# Patient Record
Sex: Male | Born: 1990 | Race: Black or African American | Marital: Single | Smoking: Former smoker
Health system: Southern US, Community
[De-identification: ages and names within clinical notes are randomized; demographics above are authoritative.]

## PROBLEM LIST (undated history)

## (undated) DIAGNOSIS — J45909 Unspecified asthma, uncomplicated: Secondary | ICD-10-CM

---
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
## 2019-02-14 ENCOUNTER — Emergency Department (HOSPITAL_COMMUNITY)
Admission: EM | Admit: 2019-02-14 | Discharge: 2019-02-14 | Disposition: A | Discharge status: Home / Self Care | Payer: Self-pay | Attending: Emergency Medicine | Admitting: Emergency Medicine

## 2019-02-14 ENCOUNTER — Encounter (HOSPITAL_COMMUNITY): Payer: Self-pay

## 2019-02-14 ENCOUNTER — Other Ambulatory Visit: Payer: Self-pay

## 2019-02-14 DIAGNOSIS — F17290 Nicotine dependence, other tobacco product, uncomplicated: Secondary | ICD-10-CM | POA: Insufficient documentation

## 2019-02-14 DIAGNOSIS — Z202 Contact with and (suspected) exposure to infections with a predominantly sexual mode of transmission: Secondary | ICD-10-CM | POA: Insufficient documentation

## 2019-02-14 DIAGNOSIS — N509 Disorder of male genital organs, unspecified: Secondary | ICD-10-CM | POA: Insufficient documentation

## 2019-02-14 MED ORDER — METRONIDAZOLE 500 MG PO TABS
2000.0000 mg | ORAL_TABLET | Freq: Once | ORAL | Status: AC
  Administered 2019-02-14: 2000 mg via ORAL
  Filled 2019-02-14: qty 4

## 2019-02-14 NOTE — ED Provider Notes (Signed)
[facility] EMERGENCY DEPARTMENT Provider Note   CSN: [id number] Arrival date & time: 02/14/19  0017    History   Chief Complaint Chief Complaint  Patient presents with  . Exposure to STD    HPI Frank Ritter is a 28 y.o. male.     Patient states that his girlfriend states he has been exposed to trichomonas.  He also complains of a growth on his scrotum  No language interpreter was used.  Exposure to STD  This is a new problem. The current episode started more than 2 days ago. The problem occurs constantly. The problem has not changed since onset.Pertinent negatives include no chest pain, no abdominal pain and no headaches. Nothing aggravates the symptoms. Nothing relieves the symptoms. The treatment provided no relief.    History reviewed. No pertinent past medical history.  There are no active problems to display for this patient.   History reviewed. No pertinent surgical history.      Home Medications    Prior to Admission medications   Not on File    Family History No family history on file.  Social History Social History   Tobacco Use  . Smoking status: Former Smoker    Packs/day: 1.00    Types: Cigarettes  . Smokeless tobacco: Never Used  . Tobacco comment: pt states he quit 2 days ago, but smokes cigar (2 a day)  Substance Use Topics  . Alcohol use: Not on file    Comment: hardly ever  . Drug use: Yes    Types: Marijuana     Allergies   Patient has no known allergies.   Review of Systems Review of Systems  Constitutional: Negative for appetite change and fatigue.  HENT: Negative for congestion, ear discharge and sinus pressure.   Eyes: Negative for discharge.  Respiratory: Negative for cough.   Cardiovascular: Negative for chest pain.  Gastrointestinal: Negative for abdominal pain and diarrhea.  Genitourinary: Negative for frequency and hematuria.       Growth on his scrotum  Musculoskeletal: Negative for back pain.  Skin:  Negative for rash.  Neurological: Negative for seizures and headaches.  Psychiatric/Behavioral: Negative for hallucinations.  All other systems reviewed and are negative.    Physical Exam Updated Vital Signs Ht 5\' 7"  (1.702 m)   Wt 113.4 kg   BMI 39.16 kg/m   Physical Exam Vitals signs and nursing note reviewed.  Constitutional:      Appearance: He is well-developed.  HENT:     Head: Normocephalic.  Eyes:     General: No scleral icterus.    Conjunctiva/sclera: Conjunctivae normal.  Neck:     Musculoskeletal: Neck supple.     Thyroid: No thyromegaly.  Cardiovascular:     Rate and Rhythm: Normal rate and regular rhythm.     Heart sounds: No murmur. No friction rub. No gallop.   Pulmonary:     Breath sounds: No stridor. No wheezing or rales.  Chest:     Chest wall: No tenderness.  Abdominal:     General: There is no distension.     Tenderness: There is no abdominal tenderness. There is no rebound.  Genitourinary:    Comments: Patient has a 5 mm mass on his scrotum that does not seem to be attached to the testicle. Musculoskeletal: Normal range of motion.  Lymphadenopathy:     Cervical: No cervical adenopathy.  Skin:    Findings: No erythema or rash.  Neurological:     Mental Status: He is  oriented to person, place, and time.     Motor: No abnormal muscle tone.     Coordination: Coordination normal.  Psychiatric:        Behavior: Behavior normal.      ED Treatments / Results  Labs (all labs ordered are listed, but only abnormal results are displayed) Labs Reviewed - No data to display  EKG None  Radiology No results found.  Procedures Procedures (including critical care time)  Medications Ordered in ED Medications  metroNIDAZOLE (FLAGYL) tablet 2,000 mg (has no administration in time range)     Initial Impression / Assessment and Plan / ED Course  I have reviewed the triage vital signs and the nursing notes.  Pertinent labs & imaging results  that were available during my care of the patient were reviewed by me and considered in my medical decision making (see chart for details).        Patient with exposure to trichomonas.  He will be given 2 g of Flagyl.  Patient also has a small growth on his scrotum which is most likely a benign cyst.  He will be referred to urology for second opinion  Final Clinical Impressions(s) / ED Diagnoses   Final diagnoses:  STD exposure    ED Discharge Orders    None       Bethann BerkshireZammit, Shian Goodnow, MD 02/14/19 0134

## 2019-02-14 NOTE — Discharge Instructions (Addendum)
Follow up with [facility] in 2-3 weeks for your growth on your scrotum.  Call for an appointment

## 2019-02-14 NOTE — ED Triage Notes (Signed)
Pt states he has a "cyst in his sac" that he noticed 2-3 days ago. Pt reports his girlfriend let him know that she has been diagnosed with trich and been treated. Pt reports having unprotected intercourse with girlfriend. Pt says the "knot or cyst is not on the outside, it is on the inside." Denies pain to this area.

## 2019-11-01 ENCOUNTER — Encounter (HOSPITAL_COMMUNITY): Payer: Self-pay

## 2019-11-01 ENCOUNTER — Emergency Department (HOSPITAL_COMMUNITY)
Admission: EM | Admit: 2019-11-01 | Discharge: 2019-11-01 | Disposition: A | Discharge status: Home / Self Care | Payer: Self-pay | Attending: Emergency Medicine | Admitting: Emergency Medicine

## 2019-11-01 ENCOUNTER — Ambulatory Visit: Payer: Self-pay | Attending: Internal Medicine

## 2019-11-01 ENCOUNTER — Other Ambulatory Visit: Payer: Self-pay

## 2019-11-01 ENCOUNTER — Emergency Department (HOSPITAL_COMMUNITY): Payer: Self-pay

## 2019-11-01 DIAGNOSIS — R05 Cough: Secondary | ICD-10-CM

## 2019-11-01 DIAGNOSIS — R059 Cough, unspecified: Secondary | ICD-10-CM

## 2019-11-01 DIAGNOSIS — Z20822 Contact with and (suspected) exposure to covid-19: Secondary | ICD-10-CM | POA: Insufficient documentation

## 2019-11-01 DIAGNOSIS — J4541 Moderate persistent asthma with (acute) exacerbation: Secondary | ICD-10-CM | POA: Insufficient documentation

## 2019-11-01 DIAGNOSIS — Z87891 Personal history of nicotine dependence: Secondary | ICD-10-CM | POA: Insufficient documentation

## 2019-11-01 HISTORY — DX: Unspecified asthma, uncomplicated: J45.909

## 2019-11-01 MED ORDER — ALBUTEROL SULFATE HFA 108 (90 BASE) MCG/ACT IN AERS
8.0000 | INHALATION_SPRAY | Freq: Once | RESPIRATORY_TRACT | Status: AC
  Administered 2019-11-01: 16:00:00 8 via RESPIRATORY_TRACT
  Filled 2019-11-01: qty 6.7

## 2019-11-01 MED ORDER — PREDNISONE 50 MG PO TABS
60.0000 mg | ORAL_TABLET | Freq: Once | ORAL | Status: AC
  Administered 2019-11-01: 16:00:00 60 mg via ORAL
  Filled 2019-11-01: qty 1

## 2019-11-01 MED ORDER — ALBUTEROL SULFATE HFA 108 (90 BASE) MCG/ACT IN AERS: 2.0000 | INHALATION_SPRAY | Freq: Four times a day (QID) | RESPIRATORY_TRACT | 1 refills | Status: DC | PRN

## 2019-11-01 MED ORDER — PREDNISONE 10 MG PO TABS: ORAL_TABLET | ORAL | 0 refills | Status: DC

## 2019-11-01 NOTE — ED Triage Notes (Signed)
Pt presents to ED with complaints of cough. Pt states he vomited with coughing. Pt denies fever.

## 2019-11-01 NOTE — ED Provider Notes (Signed)
[facility] Provider Note   CSN: [id number] Arrival date & time: 11/01/19  1307     History Chief Complaint  Patient presents with  . Cough    Frank Ritter is a 29 y.o. male.  The history is provided by the patient. No language interpreter was used.  Cough Cough characteristics:  Non-productive Sputum characteristics:  Nondescript Severity:  Moderate Onset quality:  Gradual Duration:  1 week Timing:  Constant Progression:  Worsening Chronicity:  New Smoker: yes   Relieved by:  Nothing Worsened by:  Nothing Ineffective treatments:  None tried Associated symptoms: no fever   Risk factors: no recent infection   Pt had a covid swab today.  Pt reports he has a history of asthma Pt does not have an inhaler.  Pt denies fever.      Past Medical History:  Diagnosis Date  . Asthma     There are no problems to display for this patient.   History reviewed. No pertinent surgical history.     No family history on file.  Social History   Tobacco Use  . Smoking status: Former Smoker    Packs/day: 1.00    Types: Cigarettes  . Smokeless tobacco: Never Used  . Tobacco comment: pt states he quit 2 days ago, but smokes cigar (2 a day)  Substance Use Topics  . Alcohol use: Yes    Comment: hardly ever  . Drug use: Yes    Types: Marijuana    Home Medications Prior to Admission medications   Not on File    Allergies    Patient has no known allergies.  Review of Systems   Review of Systems  Constitutional: Negative for fever.  Respiratory: Positive for cough.   All other systems reviewed and are negative.   Physical Exam Updated Vital Signs BP 137/74 (BP Location: Right Arm)   Pulse 82   Temp 98.4 F (36.9 C) (Oral)   Resp 16   Ht 5\' 7"  (1.702 m)   Wt 117.9 kg   SpO2 93%   BMI 40.72 kg/m   Physical Exam Vitals and nursing note reviewed.  Constitutional:      Appearance: He is well-developed.  HENT:     Head:  Normocephalic and atraumatic.  Eyes:     Conjunctiva/sclera: Conjunctivae normal.  Cardiovascular:     Rate and Rhythm: Normal rate and regular rhythm.     Heart sounds: No murmur.  Pulmonary:     Effort: Pulmonary effort is normal. No respiratory distress.     Breath sounds: Wheezing present.  Abdominal:     Palpations: Abdomen is soft.     Tenderness: There is no abdominal tenderness.  Musculoskeletal:     Cervical back: Neck supple.  Skin:    General: Skin is warm and dry.  Neurological:     Mental Status: He is alert.  Psychiatric:        Mood and Affect: Mood normal.     ED Results / Procedures / Treatments   Labs (all labs ordered are listed, but only abnormal results are displayed) Labs Reviewed - No data to display  EKG None  Radiology DG Chest Portable 1 View  Result Date: 11/01/2019 CLINICAL DATA:  Cough, fever EXAM: PORTABLE CHEST 1 VIEW COMPARISON:  None. FINDINGS: The heart size and mediastinal contours are within normal limits. Both lungs are clear. The visualized skeletal structures are unremarkable. IMPRESSION: No acute abnormality of the lungs in AP portable examination. Electronically Signed  By: Lauralyn Primes M.D.   On: 11/01/2019 15:49    Procedures Procedures (including critical care time)  Medications Ordered in ED Medications  predniSONE (DELTASONE) tablet 60 mg (has no administration in time range)  albuterol (VENTOLIN HFA) 108 (90 Base) MCG/ACT inhaler 8 puff (8 puffs Inhalation Given 11/01/19 1547)    ED Course  I have reviewed the triage vital signs and the nursing notes.  Pertinent labs & imaging results that were available during my care of the patient were reviewed by me and considered in my medical decision making (see chart for details).    MDM Rules/Calculators/A&P                      MDM:  Pt reports he feels much better after albuterol 8 puffs.   Covid pending.  Pt given prednisone 60 mg.  Rx for prednisone. Albuterol inhaler  to go and rx for inhaler Pt counseled on need to stop smoking  Final Clinical Impression(s) / ED Diagnoses Final diagnoses:  Moderate persistent asthma with exacerbation  Cough    Rx / DC Orders ED Discharge Orders         Ordered    predniSONE (DELTASONE) 10 MG tablet    Note to Pharmacy: Please provide dose pack or taper dose instructions   11/01/19 1610    albuterol (VENTOLIN HFA) 108 (90 Base) MCG/ACT inhaler  Every 6 hours PRN     11/01/19 1610        An After Visit Summary was printed and given to the patient.    Elson Areas, [place] 11/01/19 1610    Terald Sleeper, MD 11/01/19 2047

## 2019-11-01 NOTE — Discharge Instructions (Signed)
Stop smoking.  Return if any problems.

## 2019-11-03 LAB — NOVEL CORONAVIRUS, NAA: SARS-CoV-2, NAA: NOT DETECTED

## 2020-06-26 ENCOUNTER — Other Ambulatory Visit: Payer: Self-pay

## 2020-06-26 DIAGNOSIS — Z20822 Contact with and (suspected) exposure to covid-19: Secondary | ICD-10-CM

## 2020-06-27 LAB — NOVEL CORONAVIRUS, NAA: SARS-CoV-2, NAA: DETECTED — AB

## 2020-06-27 LAB — SARS-COV-2, NAA 2 DAY TAT

## 2020-06-28 ENCOUNTER — Telehealth (HOSPITAL_COMMUNITY): Payer: Self-pay | Admitting: Nurse Practitioner

## 2020-06-28 ENCOUNTER — Encounter: Payer: Self-pay | Admitting: Nurse Practitioner

## 2020-06-28 DIAGNOSIS — U071 COVID-19: Secondary | ICD-10-CM

## 2020-06-28 NOTE — Telephone Encounter (Signed)
Called to Discuss with patient about Covid symptoms and the use of regeneron, a monoclonal antibody infusion for those with mild to moderate Covid symptoms and at a high risk of hospitalization.     Pt is qualified for this infusion at the [facility] center due to co-morbid conditions and/or a member of an at-risk group.     Unable to reach pt. Sent text message and sent mychart message.   Consuello Masse, DNP, AGNP-C [phone] (Infusion Center Hotline)

## 2020-06-28 NOTE — Telephone Encounter (Signed)
Called to discuss with Frank Ritter about Covid symptoms and the use of regeneron, a monoclonal antibody infusion for those with mild to moderate Covid symptoms and at a high risk of hospitalization.    Pt does not qualify for infusion therapy given asymptomatic infection. Isolation precautions discussed. Advised to contact back for consideration should he develop symptoms. Patient verbalized understanding.     There are no problems to display for this patient.    Consuello Masse, NP [phone]

## 2021-05-29 ENCOUNTER — Other Ambulatory Visit: Payer: Self-pay

## 2021-05-29 ENCOUNTER — Encounter (HOSPITAL_COMMUNITY): Payer: Self-pay | Admitting: *Deleted

## 2021-05-29 ENCOUNTER — Emergency Department (HOSPITAL_COMMUNITY)
Admission: EM | Admit: 2021-05-29 | Discharge: 2021-05-29 | Disposition: A | Discharge status: Home / Self Care | Payer: Self-pay | Attending: Emergency Medicine | Admitting: Emergency Medicine

## 2021-05-29 DIAGNOSIS — Z87891 Personal history of nicotine dependence: Secondary | ICD-10-CM | POA: Insufficient documentation

## 2021-05-29 DIAGNOSIS — J4521 Mild intermittent asthma with (acute) exacerbation: Secondary | ICD-10-CM | POA: Insufficient documentation

## 2021-05-29 MED ORDER — DEXAMETHASONE 4 MG PO TABS
12.0000 mg | ORAL_TABLET | Freq: Once | ORAL | Status: AC
  Administered 2021-05-29: 12 mg via ORAL
  Filled 2021-05-29: qty 3

## 2021-05-29 MED ORDER — ALBUTEROL SULFATE HFA 108 (90 BASE) MCG/ACT IN AERS
2.0000 | INHALATION_SPRAY | Freq: Once | RESPIRATORY_TRACT | Status: AC
  Administered 2021-05-29: 2 via RESPIRATORY_TRACT
  Filled 2021-05-29: qty 6.7

## 2021-05-29 MED ORDER — ALBUTEROL SULFATE HFA 108 (90 BASE) MCG/ACT IN AERS: 1.0000 | INHALATION_SPRAY | RESPIRATORY_TRACT | 0 refills | Status: DC | PRN

## 2021-05-29 NOTE — Discharge Instructions (Signed)
You may use your albuterol inhaler 1-2 puffs every 4 or 6 hours as needed for cough or shortness of breath.  Return to the ER if you develop worsening shortness of breath despite this, or if you develop high fever, chest pain, or any other new/concerning symptoms.

## 2021-05-29 NOTE — ED Triage Notes (Signed)
Pt ran out of his inhaler and needs another one.  Pt with hx of asthma.

## 2021-05-29 NOTE — ED Provider Notes (Addendum)
[facility] EMERGENCY DEPARTMENT Provider Note   CSN: [id number] Arrival date & time: 05/29/21  6295     History Chief Complaint  Patient presents with   Asthma    Frank Ritter is a 30 y.o. male.  HPI 30 year old male presents with cough, wheezing, shortness of breath for about 24 hours.  Started yesterday and seems worse today.  No fevers, chest pain.  He states he has a history of asthma but does not have a current inhaler as it was destroyed by his dog.  He is mostly here for an inhaler.  He denies sore throat or rhinorrhea.  Past Medical History:  Diagnosis Date   Asthma     There are no problems to display for this patient.   History reviewed. No pertinent surgical history.     History reviewed. No pertinent family history.  Social History   Tobacco Use   Smoking status: Former    Packs/day: 1.00    Types: Cigarettes   Smokeless tobacco: Never   Tobacco comments:    pt states he quit 2 days ago, but smokes cigar (2 a day)  Vaping Use   Vaping Use: Former  Substance Use Topics   Alcohol use: Yes    Comment: hardly ever   Drug use: Yes    Types: Marijuana    Home Medications Prior to Admission medications   Medication Sig Start Date End Date Taking? Authorizing Provider  albuterol (VENTOLIN HFA) 108 (90 Base) MCG/ACT inhaler Inhale 1-2 puffs into the lungs every 4 (four) hours as needed for wheezing or shortness of breath. 05/29/21  Yes Pricilla Loveless, MD    Allergies    Patient has no known allergies.  Review of Systems   Review of Systems  Constitutional:  Negative for fever.  HENT:  Negative for rhinorrhea and sore throat.   Respiratory:  Positive for cough, shortness of breath and wheezing.   Cardiovascular:  Negative for chest pain.  All other systems reviewed and are negative.  Physical Exam Updated Vital Signs BP (!) 141/80 (BP Location: Right Arm)   Pulse 74   Temp 98.5 F (36.9 C) (Oral)   Resp 20   Ht 5\' 7"  (1.702 m)   Wt  126.6 kg   SpO2 94%   BMI 43.70 kg/m   Physical Exam Vitals and nursing note reviewed.  Constitutional:      General: He is not in acute distress.    Appearance: He is well-developed. He is obese. He is not ill-appearing or diaphoretic.  HENT:     Head: Normocephalic and atraumatic.     Right Ear: External ear normal.     Left Ear: External ear normal.     Nose: Nose normal.  Eyes:     General:        Right eye: No discharge.        Left eye: No discharge.  Cardiovascular:     Rate and Rhythm: Normal rate and regular rhythm.     Heart sounds: Normal heart sounds.  Pulmonary:     Effort: Pulmonary effort is normal. No tachypnea, accessory muscle usage or respiratory distress.     Breath sounds: Examination of the right-lower field reveals wheezing. Examination of the left-lower field reveals wheezing. Wheezing (mild) present.  Abdominal:     Palpations: Abdomen is soft.     Tenderness: There is no abdominal tenderness.  Musculoskeletal:     Cervical back: Neck supple.  Skin:    General: Skin  is warm and dry.  Neurological:     Mental Status: He is alert.  Psychiatric:        Mood and Affect: Mood is not anxious.    ED Results / Procedures / Treatments   Labs (all labs ordered are listed, but only abnormal results are displayed) Labs Reviewed - No data to display  EKG None  Radiology No results found.  Procedures Procedures   Medications Ordered in ED Medications  albuterol (VENTOLIN HFA) 108 (90 Base) MCG/ACT inhaler 2 puff (has no administration in time range)  dexamethasone (DECADRON) tablet 12 mg (has no administration in time range)    ED Course  I have reviewed the triage vital signs and the nursing notes.  Pertinent labs & imaging results that were available during my care of the patient were reviewed by me and considered in my medical decision making (see chart for details).    MDM Rules/Calculators/A&P                           Patient  appears to have a mild asthma exacerbation.  Offered chest x-ray given the cough and shortness of breath but he declines.  He also declines COVID testing.  He really only wants the inhaler.  Given he is having some active wheezing and will require inhaler I did discuss doing some steroids for what appears to be a milder asthma exacerbation.  He is okay with this and we will give him a dose of Decadron here for what appears to be mild asthma and discharged with return precautions. Final Clinical Impression(s) / ED Diagnoses Final diagnoses:  Mild intermittent asthma with exacerbation    Rx / DC Orders ED Discharge Orders          Ordered    albuterol (VENTOLIN HFA) 108 (90 Base) MCG/ACT inhaler  Every 4 hours PRN        05/29/21 2122             Pricilla Loveless, MD 05/29/21 2128    Pricilla Loveless, MD 05/29/21 2128

## 2021-10-16 IMAGING — DX DG CHEST 1V PORT
1 series · 1 of 1 positions shown · non-contrast
Comparison: None.

CLINICAL DATA: Cough, fever

EXAM:
PORTABLE CHEST 1 VIEW

[chest ap]
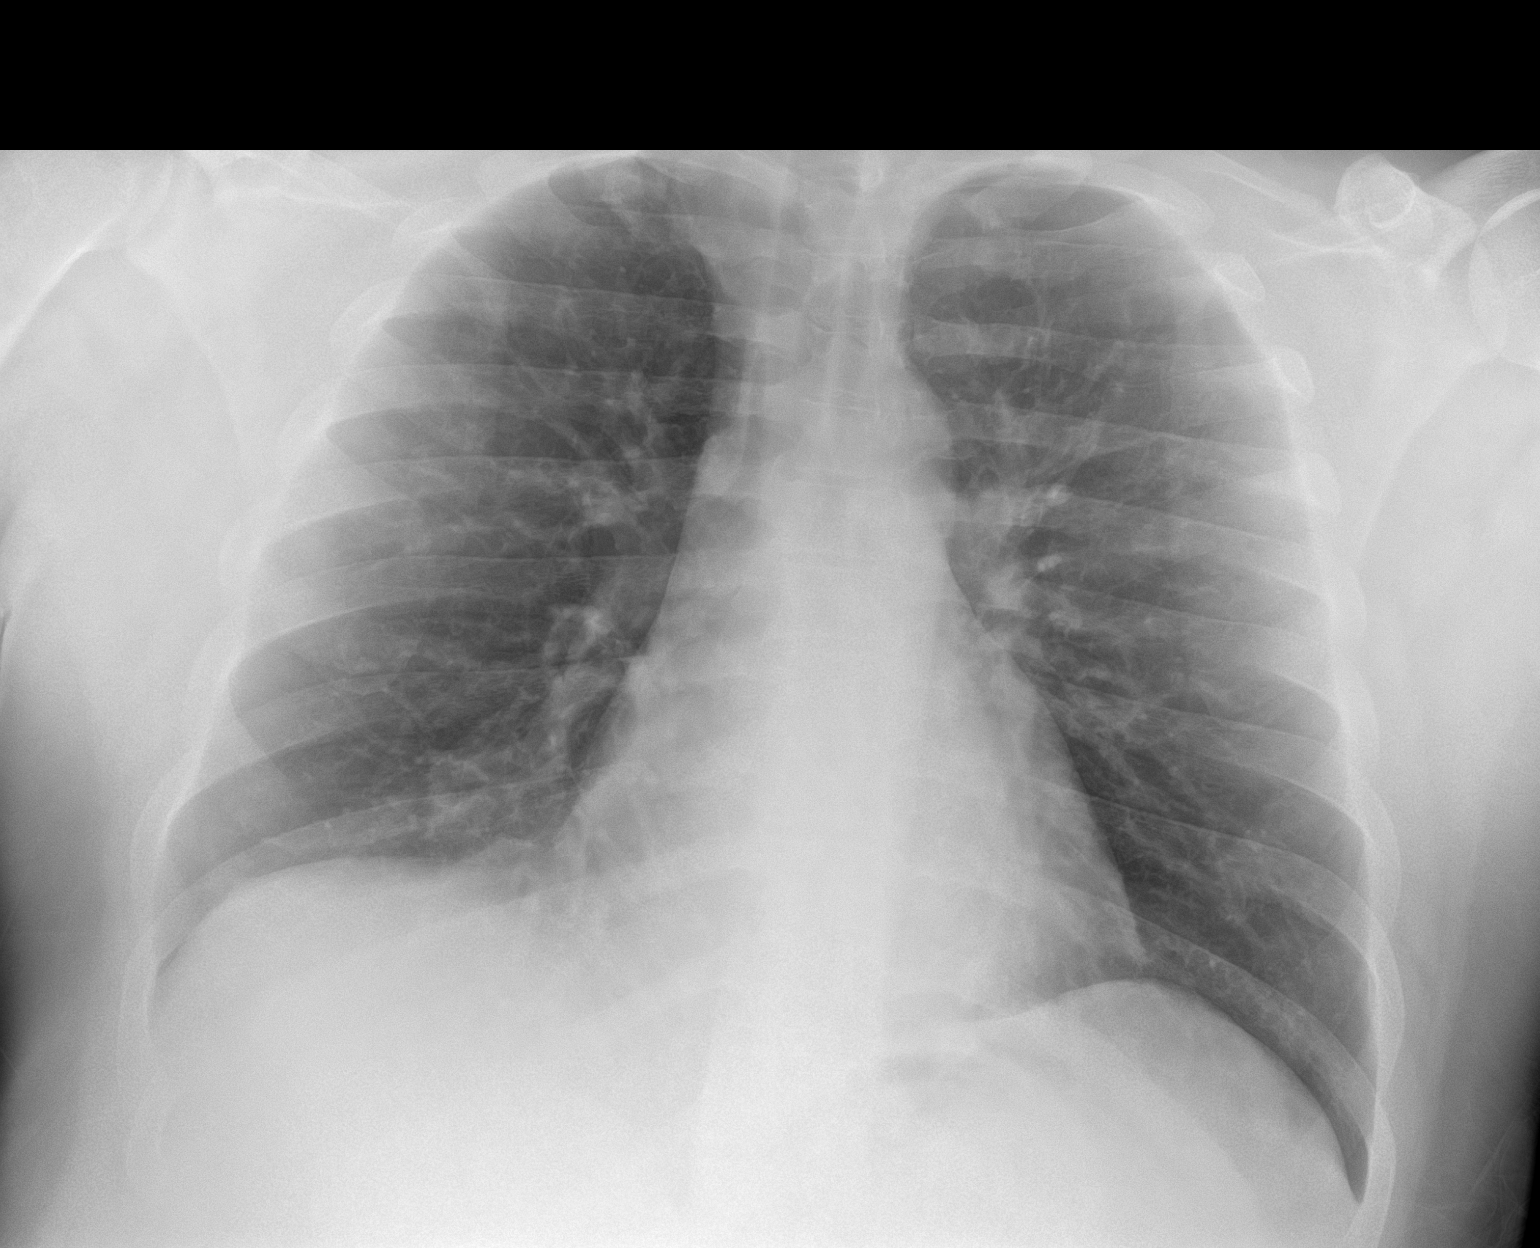

[1 of 1 positions shown; findings below may reference images not displayed]

FINDINGS: The heart size and mediastinal contours are within normal limits.
Both lungs are clear. The visualized skeletal structures are
unremarkable.
IMPRESSION: No acute abnormality of the lungs in AP portable examination.

## 2023-08-13 ENCOUNTER — Emergency Department (HOSPITAL_COMMUNITY)
Admission: EM | Admit: 2023-08-13 | Discharge: 2023-08-13 | Disposition: A | Discharge status: Home / Self Care | Payer: Self-pay | Attending: Student | Admitting: Student

## 2023-08-13 ENCOUNTER — Emergency Department (HOSPITAL_COMMUNITY): Payer: Self-pay

## 2023-08-13 ENCOUNTER — Encounter (HOSPITAL_COMMUNITY): Payer: Self-pay

## 2023-08-13 ENCOUNTER — Other Ambulatory Visit: Payer: Self-pay

## 2023-08-13 DIAGNOSIS — J90 Pleural effusion, not elsewhere classified: Secondary | ICD-10-CM

## 2023-08-13 DIAGNOSIS — Z87891 Personal history of nicotine dependence: Secondary | ICD-10-CM | POA: Insufficient documentation

## 2023-08-13 DIAGNOSIS — J45909 Unspecified asthma, uncomplicated: Secondary | ICD-10-CM | POA: Insufficient documentation

## 2023-08-13 DIAGNOSIS — Z20822 Contact with and (suspected) exposure to covid-19: Secondary | ICD-10-CM | POA: Insufficient documentation

## 2023-08-13 DIAGNOSIS — R059 Cough, unspecified: Secondary | ICD-10-CM | POA: Insufficient documentation

## 2023-08-13 DIAGNOSIS — J45901 Unspecified asthma with (acute) exacerbation: Secondary | ICD-10-CM

## 2023-08-13 DIAGNOSIS — M791 Myalgia, unspecified site: Secondary | ICD-10-CM | POA: Insufficient documentation

## 2023-08-13 DIAGNOSIS — D72829 Elevated white blood cell count, unspecified: Secondary | ICD-10-CM | POA: Insufficient documentation

## 2023-08-13 DIAGNOSIS — R0602 Shortness of breath: Secondary | ICD-10-CM | POA: Insufficient documentation

## 2023-08-13 DIAGNOSIS — R5383 Other fatigue: Secondary | ICD-10-CM | POA: Insufficient documentation

## 2023-08-13 LAB — CBC WITH DIFFERENTIAL/PLATELET
Abs Immature Granulocytes: 0.04 10*3/uL (ref 0.00–0.07)
Basophils Absolute: 0.1 10*3/uL (ref 0.0–0.1)
Basophils Relative: 0 %
Eosinophils Absolute: 0.9 10*3/uL — ABNORMAL HIGH (ref 0.0–0.5)
Eosinophils Relative: 7 %
HCT: 44.2 % (ref 39.0–52.0)
Hemoglobin: 14.7 g/dL (ref 13.0–17.0)
Immature Granulocytes: 0 %
Lymphocytes Relative: 12 %
Lymphs Abs: 1.8 10*3/uL (ref 0.7–4.0)
MCH: 31.7 pg (ref 26.0–34.0)
MCHC: 33.3 g/dL (ref 30.0–36.0)
MCV: 95.3 fL (ref 80.0–100.0)
Monocytes Absolute: 0.9 10*3/uL (ref 0.1–1.0)
Monocytes Relative: 6 %
Neutro Abs: 10.5 10*3/uL — ABNORMAL HIGH (ref 1.7–7.7)
Neutrophils Relative %: 75 %
Platelets: 393 10*3/uL (ref 150–400)
RBC: 4.64 MIL/uL (ref 4.22–5.81)
RDW: 12.2 % (ref 11.5–15.5)
WBC: 14.2 10*3/uL — ABNORMAL HIGH (ref 4.0–10.5)
nRBC: 0 % (ref 0.0–0.2)

## 2023-08-13 LAB — COMPREHENSIVE METABOLIC PANEL
ALT: 36 U/L (ref 0–44)
AST: 22 U/L (ref 15–41)
Albumin: 3.9 g/dL (ref 3.5–5.0)
Alkaline Phosphatase: 76 U/L (ref 38–126)
Anion gap: 10 (ref 5–15)
BUN: 8 mg/dL (ref 6–20)
CO2: 26 mmol/L (ref 22–32)
Calcium: 8.9 mg/dL (ref 8.9–10.3)
Chloride: 102 mmol/L (ref 98–111)
Creatinine, Ser: 0.91 mg/dL (ref 0.61–1.24)
GFR, Estimated: >60 mL/min (ref >60)
Glucose, Bld: 151 mg/dL — ABNORMAL HIGH (ref 70–99)
Potassium: 3.7 mmol/L (ref 3.5–5.1)
Sodium: 138 mmol/L (ref 135–145)
Total Bilirubin: 0.5 mg/dL (ref <1.2)
Total Protein: 7.9 g/dL (ref 6.5–8.1)

## 2023-08-13 LAB — SARS CORONAVIRUS 2 BY RT PCR: SARS Coronavirus 2 by RT PCR: NEGATIVE

## 2023-08-13 LAB — BRAIN NATRIURETIC PEPTIDE: B Natriuretic Peptide: 29 pg/mL (ref 0.0–100.0)

## 2023-08-13 LAB — TROPONIN I (HIGH SENSITIVITY): Troponin I (High Sensitivity): 4 ng/L (ref <18)

## 2023-08-13 MED ORDER — METHYLPREDNISOLONE SODIUM SUCC 125 MG IJ SOLR
125.0000 mg | Freq: Once | INTRAMUSCULAR | Status: AC
  Administered 2023-08-13: 125 mg via INTRAVENOUS
  Filled 2023-08-13: qty 2

## 2023-08-13 MED ORDER — ALBUTEROL SULFATE HFA 108 (90 BASE) MCG/ACT IN AERS: 1.0000 | INHALATION_SPRAY | Freq: Four times a day (QID) | RESPIRATORY_TRACT | 0 refills | Status: AC | PRN

## 2023-08-13 MED ORDER — PREDNISONE 20 MG PO TABS: 40.0000 mg | ORAL_TABLET | Freq: Every day | ORAL | 0 refills | Status: AC

## 2023-08-13 MED ORDER — IPRATROPIUM-ALBUTEROL 0.5-2.5 (3) MG/3ML IN SOLN
9.0000 mL | Freq: Once | RESPIRATORY_TRACT | Status: AC
  Administered 2023-08-13: 9 mL via RESPIRATORY_TRACT
  Filled 2023-08-13: qty 9

## 2023-08-13 MED ORDER — IOHEXOL 300 MG/ML  SOLN
75.0000 mL | Freq: Once | INTRAMUSCULAR | Status: AC | PRN
  Administered 2023-08-13: 75 mL via INTRAVENOUS

## 2023-08-13 MED ORDER — ALBUTEROL SULFATE (2.5 MG/3ML) 0.083% IN NEBU
10.0000 mg/h | INHALATION_SOLUTION | Freq: Once | RESPIRATORY_TRACT | Status: AC
  Administered 2023-08-13: 10 mg/h via RESPIRATORY_TRACT
  Filled 2023-08-13: qty 12

## 2023-08-13 NOTE — ED Notes (Signed)
Pt in bed, pt completed continuous neb, pt states that he is breathing better, pt satting 90-93% on room air, pt placed on 2L O2 via [place], pt lungs sounds are diminished and rhonchi

## 2023-08-13 NOTE — ED Provider Notes (Signed)
  Physical Exam  BP (!) 146/74 (BP Location: Left Arm)   Pulse 89   Temp 98.3 F (36.8 C) (Oral)   Resp 20   Ht 5\' 7"  (1.702 m)   Wt 128.4 kg   SpO2 93%   BMI 44.32 kg/m   Physical Exam  Procedures  Procedures  ED Course / MDM    Medical Decision Making Amount and/or Complexity of Data Reviewed Labs: ordered. Radiology: ordered.  Risk Prescription drug management.   Received in signout.  Shortness of breath.  Wheezing.  Feeling better and off oxygen.  CT scan done due to pleural effusion reassuring.  States he did smoke a cigarette yesterday which made him feel worse.  Will give steroids.  Will have follow-up with pulmonary due to effusions and likely asthma.  Patient states he is going to quit smoking.       Benjiman Core, MD 08/13/23 [phone]

## 2023-08-13 NOTE — ED Notes (Signed)
Pt ambulatory on room air, pt maintained and O2 sat of 93%, pt talking in full sentences, pt has wet cough.  Pt heart rate 97-117 when walking.

## 2023-08-13 NOTE — ED Triage Notes (Signed)
Pt stated that he has been coughing, had low grade fevers, and has vomited 4-5 times in the last 2-3 days. Pt also complains of chest congestion.

## 2023-08-13 NOTE — ED Provider Notes (Signed)
[place] EMERGENCY DEPARTMENT AT [facility] Provider Note  CSN: [id number] Arrival date & time: 08/13/23 0346  Chief Complaint(s) Cough  HPI Janek Garden is a 32 y.o. male with PMH asthma who presents emergency department for evaluation of myalgias, fatigue, cough and shortness of breath.  States that symptoms have been progressively worsening over the last 2 to 3 days.  States he does not have an inhaler or any medication at home to help with this.  He states that he has had multiple coughing fits that have led to posttussive emesis.  Endorses chest tightness and wheezing with chest congestion.  Denies abdominal pain, headache, fever or other systemic symptoms.   Past Medical History Past Medical History:  Diagnosis Date   Asthma    There are no problems to display for this patient.  Home Medication(s) Prior to Admission medications   Medication Sig Start Date End Date Taking? Authorizing Provider  albuterol (VENTOLIN HFA) 108 (90 Base) MCG/ACT inhaler Inhale 1-2 puffs into the lungs every 4 (four) hours as needed for wheezing or shortness of breath. 05/29/21   Pricilla Loveless, MD                                                                                                                                    Past Surgical History History reviewed. No pertinent surgical history. Family History History reviewed. No pertinent family history.  Social History Social History   Tobacco Use   Smoking status: Former    Current packs/day: 1.00    Types: Cigarettes   Smokeless tobacco: Never   Tobacco comments:    pt states he quit 2 days ago, but smokes cigar (2 a day)  Vaping Use   Vaping status: Former  Substance Use Topics   Alcohol use: Yes    Comment: hardly ever   Drug use: Yes    Types: Marijuana   Allergies Patient has no known allergies.  Review of Systems Review of Systems  Respiratory:  Positive for cough, shortness of breath and wheezing.    Gastrointestinal:  Positive for vomiting.    Physical Exam Vital Signs  I have reviewed the triage vital signs BP 134/82   Pulse 79   Temp 98.5 F (36.9 C) (Oral)   Ht 5\' 7"  (1.702 m)   Wt 128.4 kg   SpO2 92%   BMI 44.32 kg/m   Physical Exam Vitals and nursing note reviewed.  Constitutional:      General: He is not in acute distress.    Appearance: He is well-developed. He is ill-appearing.  HENT:     Head: Normocephalic and atraumatic.  Eyes:     Conjunctiva/sclera: Conjunctivae normal.  Cardiovascular:     Rate and Rhythm: Normal rate and regular rhythm.     Heart sounds: No murmur heard. Pulmonary:     Effort: Pulmonary effort is normal. No respiratory distress.  Breath sounds: Wheezing present.  Abdominal:     Palpations: Abdomen is soft.     Tenderness: There is no abdominal tenderness.  Musculoskeletal:        General: No swelling.     Cervical back: Neck supple.  Skin:    General: Skin is warm and dry.     Capillary Refill: Capillary refill takes less than 2 seconds.  Neurological:     Mental Status: He is alert.  Psychiatric:        Mood and Affect: Mood normal.     ED Results and Treatments Labs (all labs ordered are listed, but only abnormal results are displayed) Labs Reviewed  COMPREHENSIVE METABOLIC PANEL - Abnormal; Notable for the following components:      Result Value   Glucose, Bld 151 (*)    All other components within normal limits  CBC WITH DIFFERENTIAL/PLATELET - Abnormal; Notable for the following components:   WBC 14.2 (*)    Neutro Abs 10.5 (*)    Eosinophils Absolute 0.9 (*)    All other components within normal limits  SARS CORONAVIRUS 2 BY RT PCR  BRAIN NATRIURETIC PEPTIDE  TROPONIN I (HIGH SENSITIVITY)                                                                                                                          Radiology CT Chest W Contrast  Result Date: 08/13/2023 CLINICAL DATA:  Pneumonia with  complication suspected. EXAM: CT CHEST WITH CONTRAST TECHNIQUE: Multidetector CT imaging of the chest was performed during intravenous contrast administration. RADIATION DOSE REDUCTION: This exam was performed according to the departmental dose-optimization program which includes automated exposure control, adjustment of the mA and/or kV according to patient size and/or use of iterative reconstruction technique. CONTRAST:  75mL OMNIPAQUE IOHEXOL 300 MG/ML  SOLN COMPARISON:  Radiography from earlier today. FINDINGS: Cardiovascular: Normal heart size. No pericardial effusion. No acute vascular finding. Mediastinum/Nodes: Negative for mass or adenopathy. Symmetric gynecomastia. Lungs/Pleura: Right pleural effusion that is small to moderate, up to 3.5 cm in thickness. Some smooth pleural thickening is seen especially along the posterior parietal surface. Mild underlying pulmonary reticulation without consolidation. The central airways are clear. No pulmonary edema. No suspicious nodule. Upper Abdomen: Hepatic steatosis is suspected, but postcontrast only scan. Musculoskeletal: No acute or aggressive finding. No trauma or rib destruction seen associated with the effusion. IMPRESSION: Small to moderate right pleural effusion. The effusion is layering but there is some complexity and smooth pleural thickening. No underlying pneumonia or mass lesion. Mild atelectasis in the right lower lobe. Electronically Signed   By: Tiburcio Pea M.D.   On: 08/13/2023 05:28   DG Chest Portable 1 View  Result Date: 08/13/2023 CLINICAL DATA:  Dyspnea EXAM: PORTABLE CHEST 1 VIEW COMPARISON:  11/01/2019 FINDINGS: Small left pleural effusion partially obscuring the right lung base. The left lung appears clear. No associated pulmonary edema. Normal heart size and upper mediastinal contours. IMPRESSION: Right pleural effusion partially  obscuring the right lower lobe. Electronically Signed   By: Tiburcio Pea M.D.   On: 08/13/2023 04:49     Pertinent labs & imaging results that were available during my care of the patient were reviewed by me and considered in my medical decision making (see MDM for details).  Medications Ordered in ED Medications  ipratropium-albuterol (DUONEB) 0.5-2.5 (3) MG/3ML nebulizer solution 9 mL (9 mLs Nebulization Given 08/13/23 0438)  methylPREDNISolone sodium succinate (SOLU-MEDROL) 125 mg/2 mL injection 125 mg (125 mg Intravenous Given 08/13/23 0421)  iohexol (OMNIPAQUE) 300 MG/ML solution 75 mL (75 mLs Intravenous Contrast Given 08/13/23 0509)  albuterol (PROVENTIL) (2.5 MG/3ML) 0.083% nebulizer solution (10 mg/hr Nebulization Given 08/13/23 0557)                                                                                                                                     Procedures .Critical Care  Performed by: Glendora Score, MD Authorized by: Glendora Score, MD   Critical care provider statement:    Critical care time (minutes):  30   Critical care was necessary to treat or prevent imminent or life-threatening deterioration of the following conditions:  Respiratory failure   Critical care was time spent personally by me on the following activities:  Development of treatment plan with patient or surrogate, discussions with consultants, evaluation of patient's response to treatment, examination of patient, ordering and review of laboratory studies, ordering and review of radiographic studies, ordering and performing treatments and interventions, pulse oximetry, re-evaluation of patient's condition and review of old charts   (including critical care time)  Medical Decision Making / ED Course   This patient presents to the ED for concern of shortness of breath, cough, this involves an extensive number of treatment options, and is a complaint that carries with it a high risk of complications and morbidity.  The differential diagnosis includes Pe, PTX, Pulmonary Edema, ARDS, COPD/Asthma,  ACS, CHF exacerbation, Arrhythmia, Pericardial Effusion/Tamponade, Anemia, Sepsis, Acidosis/Hypercapnia, Anxiety, Viral URI  MDM: Patient seen emergency room for evaluation of shortness of breath and cough.  Physical exam with bilateral expiratory wheezing and tachypnea.  Laboratory evaluation with a leukocytosis to 14.2 but is otherwise unremarkable.  High-sensitivity troponin and BNP negative.  COVID-negative.  Chest x-ray with a possible right-sided pleural effusion.  Follow-up CT chest showing a moderate layering pleural effusion but no evidence of pneumonia.  Patient given Solu-Medrol and 3 DuoNebs and on reevaluation, wheezing persistent.  Patient then given a 10 mg continuous albuterol treatment.  At time of signout, patient pending reevaluation after completion of albuterol.  If wheezing persistent, anticipate hospital admission.  Please see provider signout note for continuation of workup.   Additional history obtained:  -External records from outside source obtained and reviewed including: Chart review including previous notes, labs, imaging, consultation notes   Lab Tests: -I ordered, reviewed, and interpreted labs.   The pertinent results include:  Labs Reviewed  COMPREHENSIVE METABOLIC PANEL - Abnormal; Notable for the following components:      Result Value   Glucose, Bld 151 (*)    All other components within normal limits  CBC WITH DIFFERENTIAL/PLATELET - Abnormal; Notable for the following components:   WBC 14.2 (*)    Neutro Abs 10.5 (*)    Eosinophils Absolute 0.9 (*)    All other components within normal limits  SARS CORONAVIRUS 2 BY RT PCR  BRAIN NATRIURETIC PEPTIDE  TROPONIN I (HIGH SENSITIVITY)      Imaging Studies ordered: I ordered imaging studies including chest x-ray, CT chest I independently visualized and interpreted imaging. I agree with the radiologist interpretation   Medicines ordered and prescription drug management: Meds ordered this encounter   Medications   ipratropium-albuterol (DUONEB) 0.5-2.5 (3) MG/3ML nebulizer solution 9 mL   methylPREDNISolone sodium succinate (SOLU-MEDROL) 125 mg/2 mL injection 125 mg   iohexol (OMNIPAQUE) 300 MG/ML solution 75 mL   albuterol (PROVENTIL) (2.5 MG/3ML) 0.083% nebulizer solution    -I have reviewed the patients home medicines and have made adjustments as needed  Critical interventions Multiple DuoNebs, steroids    Cardiac Monitoring: The patient was maintained on a cardiac monitor.  I personally viewed and interpreted the cardiac monitored which showed an underlying rhythm of: NSR  Social Determinants of Health:  Factors impacting patients care include: 20 Pack-year history of smoking  Reevaluation: After the interventions noted above, I reevaluated the patient and found that they have :improved  Co morbidities that complicate the patient evaluation  Past Medical History:  Diagnosis Date   Asthma       Dispostion: I considered admission for this patient, and disposition pending reevaluation by oncoming provider.  Please see provider signout for continuation of workup.     Final Clinical Impression(s) / ED Diagnoses Final diagnoses:  None     @PCDICTATION @    Glendora Score, MD 08/13/23 [phone]

## 2023-09-03 NOTE — Progress Notes (Unsigned)
   Frank Ritter, male    DOB: [birth year]    MRN: [id number]   Brief patient profile:  32   yo*** *** referred to pulmonary clinic in [place]  09/05/2023 by *** for ***      History of Present Illness  09/05/2023  Pulmonary/ 1st office eval/ [name] / [name]  No chief complaint on file.    Dyspnea:  *** Cough: *** Sleep: *** SABA use: *** 02: *** Lung cancer screen: ***   Outpatient Medications Prior to Visit  Medication Sig Dispense Refill   albuterol (VENTOLIN HFA) 108 (90 Base) MCG/ACT inhaler Inhale 1-2 puffs into the lungs every 6 (six) hours as needed for wheezing or shortness of breath. 18 g 0   No facility-administered medications prior to visit.    Past Medical History:  Diagnosis Date   Asthma       Objective:     There were no vitals taken for this visit.         Assessment   No problem-specific Assessment & Plan notes found for this encounter.     Sandrea Hughs, MD 09/03/2023

## 2023-09-04 ENCOUNTER — Telehealth: Payer: Self-pay | Admitting: Internal Medicine

## 2023-09-04 NOTE — Telephone Encounter (Signed)
We cannot guarantee ANYTHING will be prescribed to a patient. This is dependent on the exam and the provider's treatment recommendations. We do not have any nebulizers in the office, we would have to prescribe one to a local DME and he would likely have to pay up front for the machine.   If he does not have insurance he can contact the [facility] Department for assistance with locating a clinic [phone].

## 2023-09-04 NOTE — Telephone Encounter (Signed)
Pt has no insurance and he is willing to pay for the appt tomorrow in cash.   His concern is that he needs assurance that he will be prescribed a nebulizer machine. He used to be on one but lost it in a move and that was all that used to work for him for his asthma. Also, they gave him breathing tx in the ER of late and that brought him relief.   Per Fredric Mare I was told to ask:  Do we have one in office that will be available for him? Please call PT to advise [phone]  He would like a VM message if you can not reach him or send a message on [facility]

## 2023-09-04 NOTE — Telephone Encounter (Signed)
Contacted patient, he will try to call the health department tomorrow. Patient canceled his appointment.

## 2023-09-05 ENCOUNTER — Encounter: Payer: Self-pay | Admitting: Internal Medicine

## 2023-09-05 ENCOUNTER — Institutional Professional Consult (permissible substitution): Payer: Medicaid Other | Admitting: Internal Medicine

## 2024-10-19 ENCOUNTER — Encounter (HOSPITAL_COMMUNITY): Payer: Self-pay

## 2024-10-19 ENCOUNTER — Emergency Department (HOSPITAL_COMMUNITY)
Admission: EM | Admit: 2024-10-19 | Discharge: 2024-10-19 | Disposition: A | Discharge status: Home / Self Care | Payer: Self-pay | Attending: Emergency Medicine | Admitting: Emergency Medicine

## 2024-10-19 ENCOUNTER — Other Ambulatory Visit: Payer: Self-pay

## 2024-10-19 DIAGNOSIS — H60501 Unspecified acute noninfective otitis externa, right ear: Secondary | ICD-10-CM | POA: Insufficient documentation

## 2024-10-19 MED ORDER — CIPROFLOXACIN-DEXAMETHASONE 0.3-0.1 % OT SUSP
4.0000 [drp] | Freq: Two times a day (BID) | OTIC | Status: DC
  Administered 2024-10-19: 4 [drp] via OTIC
  Filled 2024-10-19: qty 7.5

## 2024-10-19 NOTE — ED Provider Notes (Signed)
" ° °  [place] EMERGENCY DEPARTMENT AT [facility]  Provider Note  CSN: [id number] Arrival date & time: 10/19/24 0235  History Chief Complaint  Patient presents with   Ear Fullness    Frank Ritter is a 34 y.o. male reports 4 days of R ear fullness and pain. No drainage. Sounds like water in there. No fevers or URI symptoms. No drainage.    Home Medications Prior to Admission medications  Medication Sig Start Date End Date Taking? Authorizing Provider  albuterol  (VENTOLIN  HFA) 108 (90 Base) MCG/ACT inhaler Inhale 1-2 puffs into the lungs every 6 (six) hours as needed for wheezing or shortness of breath. 08/13/23   Patsey Lot, MD     Allergies    Patient has no known allergies.   Review of Systems   Review of Systems Please see HPI for pertinent positives and negatives  Physical Exam BP (!) 146/97 (BP Location: Right Arm)   Pulse 75   Temp 98.4 F (36.9 C) (Oral)   Resp 18   Ht 5' 7 (1.702 m)   Wt 122.5 kg   SpO2 97%   BMI 42.29 kg/m   Physical Exam Vitals and nursing note reviewed.  HENT:     Head: Normocephalic.     Left Ear: Tympanic membrane normal.     Ears:     Comments: R TM obscured by OE    Nose: Nose normal.  Eyes:     Extraocular Movements: Extraocular movements intact.  Pulmonary:     Effort: Pulmonary effort is normal.  Musculoskeletal:        General: Normal range of motion.     Cervical back: Neck supple.  Skin:    Findings: No rash (on exposed skin).  Neurological:     Mental Status: He is alert and oriented to person, place, and time.  Psychiatric:        Mood and Affect: Mood normal.     ED Results / Procedures / Treatments   EKG None  Procedures Procedures  Medications Ordered in the ED Medications  ciprofloxacin -dexamethasone  (CIPRODEX ) 0.3-0.1 % OTIC (EAR) suspension 4 drop (has no administration in time range)    Initial Impression and Plan  Patient here with R ear pain, exam consistent with  uncomplicated OE. Given ciprodex  drops. Recommend PCP follow up, RTED for any other concerns.    ED Course       MDM Rules/Calculators/A&P Medical Decision Making Problems Addressed: Acute otitis externa of right ear, unspecified type: acute illness or injury  Risk Prescription drug management.     Final Clinical Impression(s) / ED Diagnoses Final diagnoses:  Acute otitis externa of right ear, unspecified type    Rx / DC Orders ED Discharge Orders     None        Roselyn Carlin NOVAK, MD 10/19/24 0327  "

## 2024-10-19 NOTE — Discharge Instructions (Signed)
 Use the antibiotic drops as directed; 4 drops in right ear twice a day for 7 days. Avoid getting water in the ear.

## 2024-10-19 NOTE — ED Triage Notes (Signed)
 Patient from home for R ear fullness x4 days. States it feels like it is full of water; painful to touch. Upon arrival to ER, patient is alert and oriented, ambu
